# Patient Record
Sex: Female | Born: 1970 | Race: White | Hispanic: No | Smoking: Current every day smoker
Health system: Southern US, Community
[De-identification: ages and names within clinical notes are randomized; demographics above are authoritative.]

---

## 2006-05-14 ENCOUNTER — Ambulatory Visit: Payer: Self-pay | Admitting: General Surgery

## 2006-06-02 ENCOUNTER — Inpatient Hospital Stay: Payer: Self-pay | Admitting: General Surgery

## 2007-02-17 ENCOUNTER — Inpatient Hospital Stay (HOSPITAL_COMMUNITY): Admission: AD | Admit: 2007-02-17 | Discharge: 2007-02-17 | Payer: Self-pay | Admitting: Obstetrics & Gynecology

## 2011-01-10 LAB — WET PREP, GENITAL
Clue Cells Wet Prep HPF POC: NONE SEEN
Trich, Wet Prep: NONE SEEN
Yeast Wet Prep HPF POC: NONE SEEN

## 2011-01-10 LAB — URINALYSIS, ROUTINE W REFLEX MICROSCOPIC
Nitrite: NEGATIVE
Urobilinogen, UA: 0.2

## 2011-01-10 LAB — URINE MICROSCOPIC-ADD ON

## 2011-01-10 LAB — GC/CHLAMYDIA PROBE AMP, GENITAL: Chlamydia, DNA Probe: NEGATIVE

## 2011-01-10 LAB — POCT PREGNANCY, URINE: Preg Test, Ur: NEGATIVE

## 2011-01-10 LAB — CBC
MCV: 92.1
Platelets: 459 — ABNORMAL HIGH

## 2012-05-15 ENCOUNTER — Emergency Department: Payer: Self-pay | Admitting: Internal Medicine

## 2012-05-15 LAB — URINALYSIS, COMPLETE
Glucose,UR: NEGATIVE mg/dL (ref 0–75)
Ketone: NEGATIVE
Protein: NEGATIVE
Specific Gravity: 1.012 (ref 1.003–1.030)
Squamous Epithelial: 4
WBC UR: 2 /HPF (ref 0–5)

## 2012-05-15 LAB — CBC
HGB: 13.4 g/dL (ref 12.0–16.0)
Platelet: 371 10*3/uL (ref 150–440)
RBC: 4.14 10*6/uL (ref 3.80–5.20)

## 2012-05-15 LAB — PREGNANCY, URINE: Pregnancy Test, Urine: NEGATIVE m[IU]/mL

## 2014-05-03 ENCOUNTER — Emergency Department: Payer: Self-pay | Admitting: Emergency Medicine

## 2014-05-03 LAB — CBC WITH DIFFERENTIAL/PLATELET
Basophil #: 0 10*3/uL (ref 0.0–0.1)
Basophil %: 0.3 %
EOS PCT: 0.5 %
Eosinophil #: 0.1 10*3/uL (ref 0.0–0.7)
HCT: 39 % (ref 35.0–47.0)
HGB: 13 g/dL (ref 12.0–16.0)
LYMPHS PCT: 8.4 %
Lymphocyte #: 1.3 10*3/uL (ref 1.0–3.6)
MCH: 31.4 pg (ref 26.0–34.0)
MCHC: 33.4 g/dL (ref 32.0–36.0)
MCV: 94 fL (ref 80–100)
MONO ABS: 0.6 x10 3/mm (ref 0.2–0.9)
MONOS PCT: 4 %
NEUTROS PCT: 86.8 %
Neutrophil #: 13.8 10*3/uL — ABNORMAL HIGH (ref 1.4–6.5)
PLATELETS: 373 10*3/uL (ref 150–440)
RBC: 4.16 10*6/uL (ref 3.80–5.20)
RDW: 12.7 % (ref 11.5–14.5)
WBC: 15.9 10*3/uL — AB (ref 3.6–11.0)

## 2014-05-03 LAB — COMPREHENSIVE METABOLIC PANEL
ALT: 14 U/L (ref 14–63)
ANION GAP: 8 (ref 7–16)
AST: 17 U/L (ref 15–37)
Albumin: 4.5 g/dL (ref 3.4–5.0)
Alkaline Phosphatase: 42 U/L — ABNORMAL LOW (ref 46–116)
BUN: 19 mg/dL — AB (ref 7–18)
Bilirubin,Total: 0.7 mg/dL (ref 0.2–1.0)
CALCIUM: 9 mg/dL (ref 8.5–10.1)
CHLORIDE: 109 mmol/L — AB (ref 98–107)
CO2: 23 mmol/L (ref 21–32)
CREATININE: 0.91 mg/dL (ref 0.60–1.30)
Glucose: 99 mg/dL (ref 65–99)
Osmolality: 282 (ref 275–301)
Potassium: 3.8 mmol/L (ref 3.5–5.1)
Sodium: 140 mmol/L (ref 136–145)
Total Protein: 7.1 g/dL (ref 6.4–8.2)

## 2014-05-03 LAB — URINALYSIS, COMPLETE
BLOOD: NEGATIVE
Bacteria: NONE SEEN
Bilirubin,UR: NEGATIVE
GLUCOSE, UR: NEGATIVE mg/dL (ref 0–75)
Ketone: NEGATIVE
Leukocyte Esterase: NEGATIVE
Nitrite: NEGATIVE
PROTEIN: NEGATIVE
Ph: 7 (ref 4.5–8.0)
RBC,UR: 2 /HPF (ref 0–5)
SPECIFIC GRAVITY: 1.023 (ref 1.003–1.030)
WBC UR: 2 /HPF (ref 0–5)

## 2014-05-03 LAB — LIPASE, BLOOD: Lipase: 106 U/L (ref 73–393)

## 2014-05-03 LAB — TROPONIN I

## 2016-01-04 ENCOUNTER — Emergency Department: Payer: Self-pay

## 2016-01-04 ENCOUNTER — Encounter: Payer: Self-pay | Admitting: *Deleted

## 2016-01-04 ENCOUNTER — Emergency Department
Admission: EM | Admit: 2016-01-04 | Discharge: 2016-01-04 | Disposition: A | Discharge status: Home / Self Care | Payer: Self-pay | Attending: Emergency Medicine | Admitting: Emergency Medicine

## 2016-01-04 DIAGNOSIS — R079 Chest pain, unspecified: Secondary | ICD-10-CM | POA: Insufficient documentation

## 2016-01-04 DIAGNOSIS — G43809 Other migraine, not intractable, without status migrainosus: Secondary | ICD-10-CM | POA: Insufficient documentation

## 2016-01-04 DIAGNOSIS — F17200 Nicotine dependence, unspecified, uncomplicated: Secondary | ICD-10-CM | POA: Insufficient documentation

## 2016-01-04 DIAGNOSIS — R0602 Shortness of breath: Secondary | ICD-10-CM | POA: Insufficient documentation

## 2016-01-04 LAB — CBC
HEMATOCRIT: 39.9 % (ref 35.0–47.0)
HEMOGLOBIN: 13.3 g/dL (ref 12.0–16.0)
MCH: 28.9 pg (ref 26.0–34.0)
MCHC: 33.3 g/dL (ref 32.0–36.0)
MCV: 86.8 fL (ref 80.0–100.0)
Platelets: 456 10*3/uL — ABNORMAL HIGH (ref 150–440)
RBC: 4.6 MIL/uL (ref 3.80–5.20)
RDW: 15.4 % — ABNORMAL HIGH (ref 11.5–14.5)
WBC: 8.1 10*3/uL (ref 3.6–11.0)

## 2016-01-04 LAB — BASIC METABOLIC PANEL
ANION GAP: 9 (ref 5–15)
BUN: 6 mg/dL (ref 6–20)
CHLORIDE: 102 mmol/L (ref 101–111)
CO2: 25 mmol/L (ref 22–32)
Calcium: 9.3 mg/dL (ref 8.9–10.3)
Creatinine, Ser: 0.78 mg/dL (ref 0.44–1.00)
GFR calc non Af Amer: >60 mL/min (ref >60)
Glucose, Bld: 96 mg/dL (ref 65–99)
POTASSIUM: 4.2 mmol/L (ref 3.5–5.1)
Sodium: 136 mmol/L (ref 135–145)

## 2016-01-04 LAB — TROPONIN I

## 2016-01-04 MED ORDER — LORAZEPAM 2 MG/ML IJ SOLN
1.0000 mg | Freq: Once | INTRAMUSCULAR | Status: AC
  Administered 2016-01-04: 1 mg via INTRAVENOUS
  Filled 2016-01-04: qty 1

## 2016-01-04 MED ORDER — PROMETHAZINE HCL 25 MG/ML IJ SOLN
25.0000 mg | Freq: Once | INTRAMUSCULAR | Status: AC
  Administered 2016-01-04: 25 mg via INTRAVENOUS

## 2016-01-04 MED ORDER — PROMETHAZINE HCL 25 MG/ML IJ SOLN
INTRAMUSCULAR | Status: AC
  Administered 2016-01-04: 25 mg via INTRAVENOUS
  Filled 2016-01-04: qty 1

## 2016-01-04 MED ORDER — METHYLPREDNISOLONE SODIUM SUCC 125 MG IJ SOLR
125.0000 mg | Freq: Once | INTRAMUSCULAR | Status: AC
  Administered 2016-01-04: 125 mg via INTRAVENOUS
  Filled 2016-01-04 (×2): qty 2

## 2016-01-04 MED ORDER — METOCLOPRAMIDE HCL 5 MG/ML IJ SOLN
10.0000 mg | Freq: Once | INTRAMUSCULAR | Status: AC
  Administered 2016-01-04: 10 mg via INTRAVENOUS
  Filled 2016-01-04 (×2): qty 2

## 2016-01-04 MED ORDER — KETOROLAC TROMETHAMINE 30 MG/ML IJ SOLN
30.0000 mg | Freq: Once | INTRAMUSCULAR | Status: AC
  Administered 2016-01-04: 30 mg via INTRAVENOUS
  Filled 2016-01-04: qty 1

## 2016-01-04 MED ORDER — SODIUM CHLORIDE 0.9 % IV BOLUS (SEPSIS)
1000.0000 mL | Freq: Once | INTRAVENOUS | Status: AC
  Administered 2016-01-04: 1000 mL via INTRAVENOUS

## 2016-01-04 MED ORDER — DIPHENHYDRAMINE HCL 50 MG/ML IJ SOLN
50.0000 mg | Freq: Once | INTRAMUSCULAR | Status: AC
  Administered 2016-01-04: 50 mg via INTRAVENOUS
  Filled 2016-01-04: qty 1

## 2016-01-04 NOTE — Discharge Instructions (Signed)
Please follow-up your neurologist as soon as possible regarding her continued migraine headache. Return to the emergency department for any increased chest pain, trouble breathing, or any other symptom personally concerning to yourself.

## 2016-01-04 NOTE — ED Notes (Signed)
Pt reports that she stepped out of the shower and had shooting pain that went up her left leg to her back and up into her chest and head.  Pt states that she is still hurting everywhere and is visibly anxious.  Pt reports history of anxiety and reports a headache at this time.

## 2016-01-04 NOTE — ED Provider Notes (Addendum)
[facility]  Time seen: 7:34 PM  I have reviewed the triage vital signs and the nursing notes.   HISTORY  Chief Complaint Chest Pain    HPI Jennifer Horne is a 45 y.o. female with a past medical history of brain aneurysm, who presents the emergency department for headache and chest pain. According to the patient approximately 1 hour ago she had sudden onset of pain that shot up from her left foot all the way up her leg and into her chest. Patient states since that time she has been in the panic attack breathing heavily with a lot of anxiety. She took her Klonopin at home without relief. Patient states she had a history of a brain aneurysm in the past, she is very scared that this could be the brain aneurysm acting up again. She does state a moderate headache currently, but states she always has headaches and migraines occur on a near daily basis for her. Denies any focal weakness or numbness. Denies nausea or vomiting. Denies fever. States pain remains in her chest, but she often gets panic attacks with similar chest pain.   No past medical history on file.  There are no active problems to display for this patient.   No past surgical history on file.  Prior to Admission medications   Not on File    No Known Allergies  No family history on file.  Social History Social History  Substance Use Topics  . Smoking status: Current Every Day Smoker  . Smokeless tobacco: Never Used  . Alcohol use Yes    Review of Systems Constitutional: Negative for fever Cardiovascular: Positive for chest pain, but relates this to panic attacks. Respiratory: Some shortness of breath, but she states she is feeling very anxious. Musculoskeletal: Negative for back pain. Skin: Negative for rash. Neurological: Positive for headache, but states headaches on a near daily basis, and this was no worse than her typical headache 10-point ROS  otherwise negative.  ____________________________________________   PHYSICAL EXAM:  VITAL SIGNS: ED Triage Vitals  Enc Vitals Group     BP 01/04/16 1911 (!) 142/84     Pulse Rate 01/04/16 1911 96     Resp 01/04/16 1911 20     Temp 01/04/16 1911 98.2 F (36.8 C)     Temp Source 01/04/16 1911 Oral     SpO2 01/04/16 1911 100 %     Weight 01/04/16 1911 140 lb (63.5 kg)     Height 01/04/16 1911 5\' 1"  (1.549 m)     Head Circumference --      Peak Flow --      Pain Score 01/04/16 1915 9     Pain Loc --      Pain Edu? --      Excl. in GC? --     Constitutional: Alert and oriented. Very anxious appearing, crying at times. Eyes: Normal exam ENT   Head: Normocephalic and atraumatic.   Mouth/Throat: Mucous membranes are moist. Cardiovascular: Normal rate, regular rhythm. No murmur Respiratory: Normal respiratory effort without tachypnea nor retractions. Breath sounds are clear  Gastrointestinal: Soft and nontender. No distention.  Musculoskeletal: Nontender with normal range of motion in all extremities. No lower extremity tenderness or edema. Neurologic:  Normal speech and language. No gross focal neurologic deficits Skin:  Skin is warm, dry and intact.  Psychiatric: Mood and affect are normal. Speech and behavior are normal.   ____________________________________________    EKG  EKG reviewed and interpreted, so shows normal sinus rhythm at 89 bpm, narrow QRS, normal axis, normal intervals, no concerning ST changes.  ____________________________________________    RADIOLOGY  Chest x-ray negative  ____________________________________________   INITIAL IMPRESSION / ASSESSMENT AND PLAN / ED COURSE  Pertinent labs & imaging results that were available during my care of the patient were reviewed by me and considered in my medical decision making (see chart for details).  Patient presents the emergency department with pain that started in her foot when all the way  up her leg into her chest. Now she states she has a moderate headache as well. Patient is very concerned that she has a history of a brain aneurysm in the past, however she states the headache feels like her normal daily headache. States she often gets chest pain with her anxiety. States she takes 2 mg Klonopin 3 times daily for anxiety. Overall the patient has a normal physical examination besides appearing quite anxious, tearful at times. We will check labs including cardiac enzymes, treat the patient's anxiety with IV Ativan, IV fluids and Toradol for the headache, and closely monitor in the emergency department. I discussed with the patient tinea CT scan, patient is reluctant states she has had multiple including 1 recently.  Patient's workup for the chest pain including labs, troponin chest x-ray and EKG are largely within normal limits. Troponin is negative. Patient continues to state she is having significant headache. I again discussed obtaining a head CT with the patient, she is adamantly against obtaining a head CT, states she will not have any brain imaging unless it is at [facility] by her neurologist.. Patient is very upset in the emergency department states she always gets Demerol for her migraines. I discussed with the patient that I do not use narcotic medications to treat migraine headaches, which has significantly upset the patient. She states she is leaving and going to [facility]. Then she states she doesn't have a ride and to give me something else for the headache. We will continue with the migraine cocktail with Reglan, Benadryl and Solu-Medrol. Patient is much more well appearing at this time. No longer appears anxious, but very angry appearing.  Patient states her headache is a 9/10 still. She still does not wish to proceed with a CT scan. States only thing that works for her in the past as Demerol. We will discharge the patient per her request. ____________________________________________   FINAL  CLINICAL IMPRESSION(S) / ED DIAGNOSES  Migraine headache    Minna Antis, MD 01/04/16 2034    Minna Antis, MD 01/04/16 2205

## 2016-01-04 NOTE — ED Notes (Signed)
Pt reports that the medication given did not help at all.  Pt visibly looks less anxious at this time.  EDP notified.

## 2016-01-04 NOTE — ED Triage Notes (Signed)
Pt reports pain in left leg radiating all the way up  Into her body, into chest and head after getting out of shower today.  Pt states pain brought her to her knees.   Pt anxious.  Pt has a cough for 1 week.  Pt alert.  Speech clear.

## 2016-03-20 IMAGING — CT CT ABD-PELV W/ CM
2 of 5 series · 16 of 46 positions shown, 18 images · IV contrast (omnipaque)
Comparison: 05/15/2012

CLINICAL DATA: N/v starting at 5 am today, headache, "labor pains",
started lmp yesterday, hx of btl and divert

EXAM:
CT ABDOMEN AND PELVIS WITH CONTRAST
TECHNIQUE: Multidetector CT imaging of the abdomen and pelvis was performed
using the standard protocol following bolus administration of
intravenous contrast.
CONTRAST:  85 mL of Omnipaque 300 intravenous contrast.

[Series 2: routine abd pel with · axial · 0.56mm/px · z∈[-450,-100]mm · 13 of 80 slices shown, 15 images]
[im 5/80  soft-tissue]
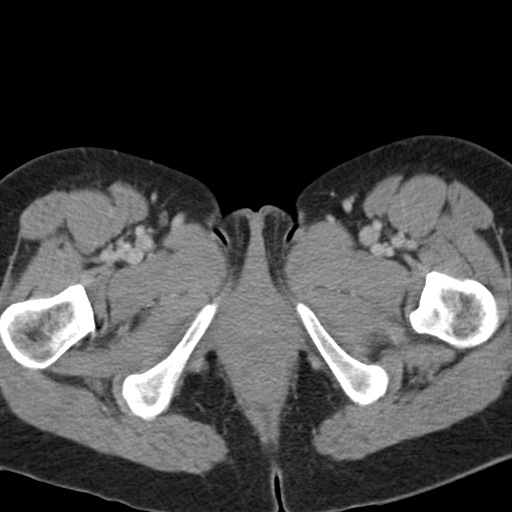
[im 5/80  bone]
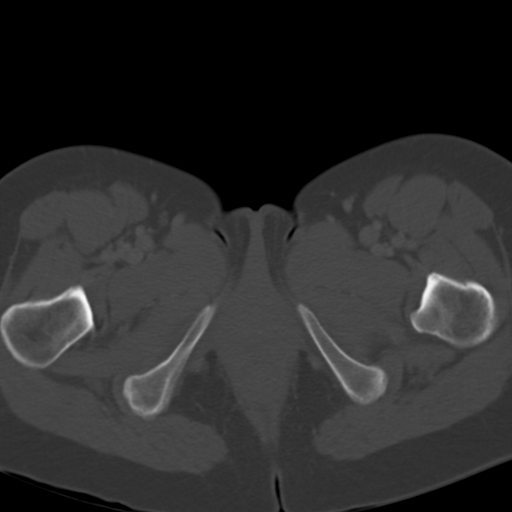
[im 13/80  soft-tissue]
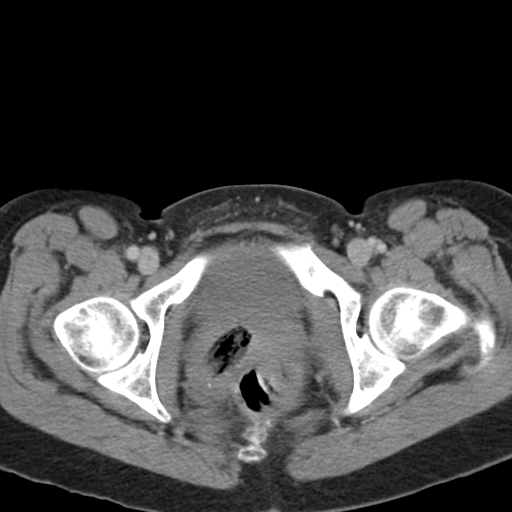
[im 17/80  soft-tissue]
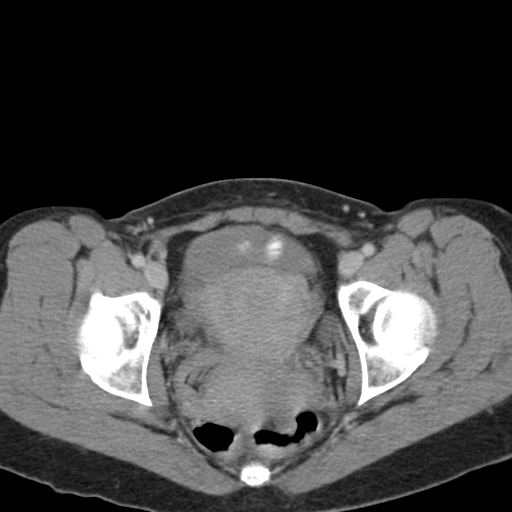
[im 21/80  soft-tissue]
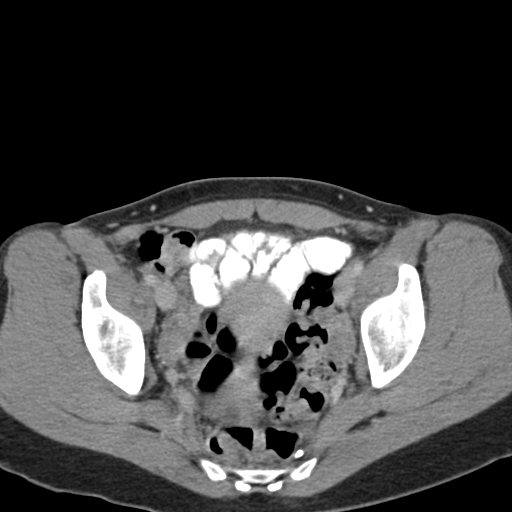
[im 30/80  soft-tissue]
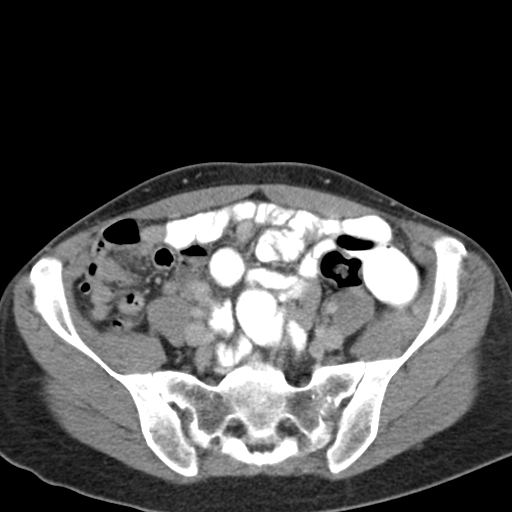
[im 34/80  soft-tissue]
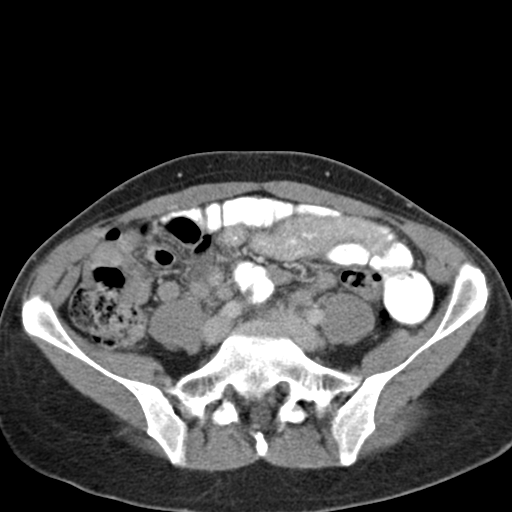
[im 42/80  soft-tissue]
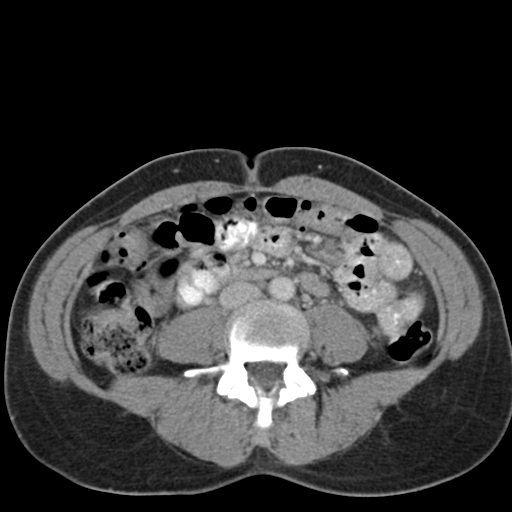
[im 46/80  soft-tissue]
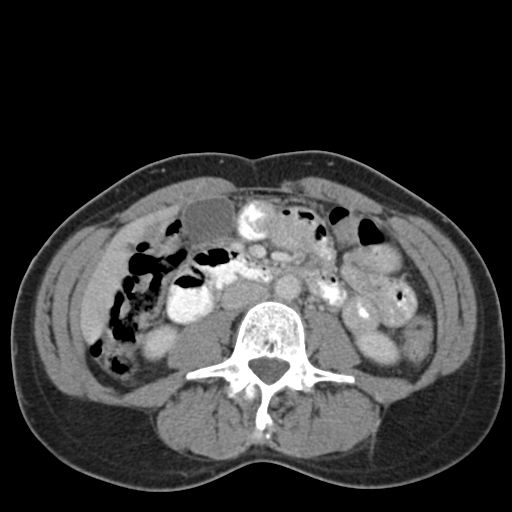
[im 50/80  soft-tissue]
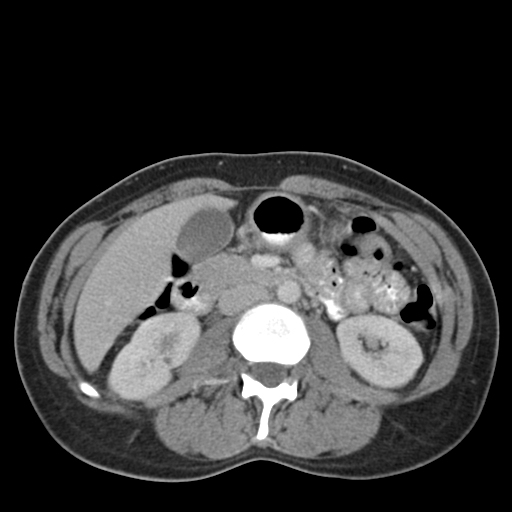
[im 50/80  bone]
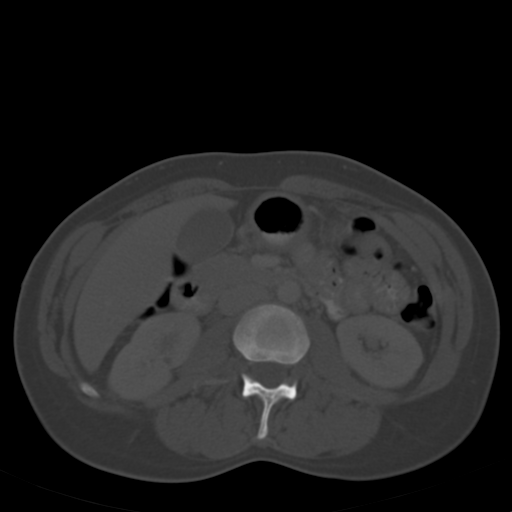
[im 59/80  soft-tissue]
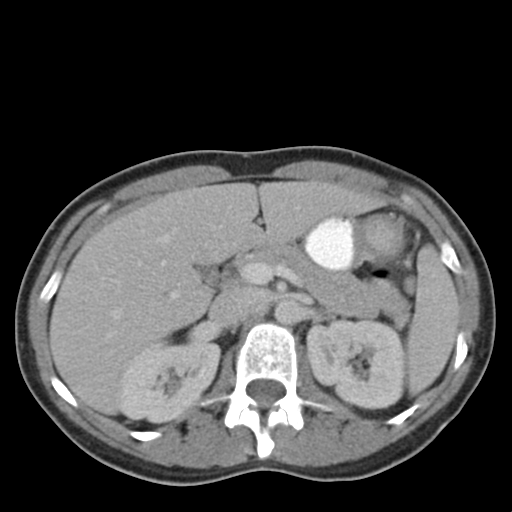
[im 63/80  soft-tissue]
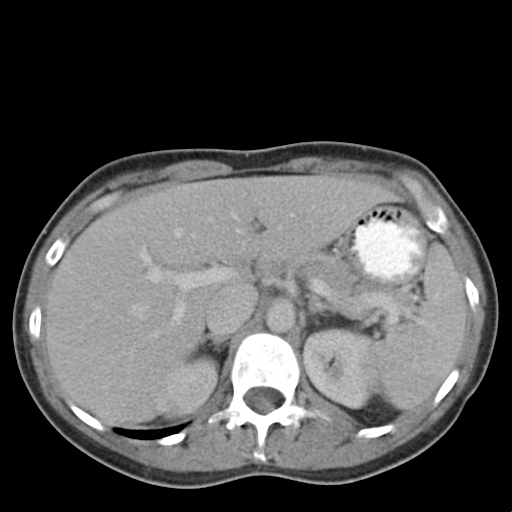
[im 67/80  soft-tissue]
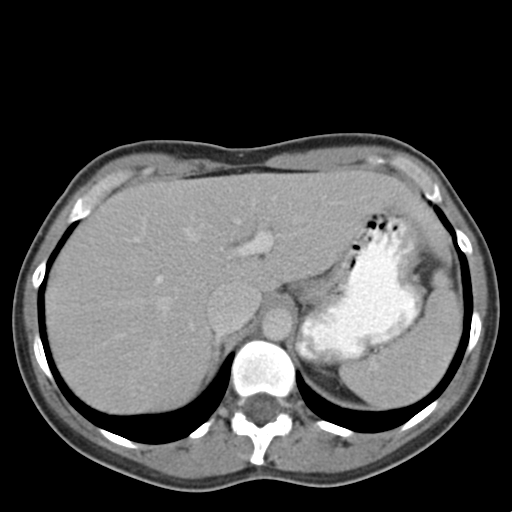
[im 75/80  soft-tissue]
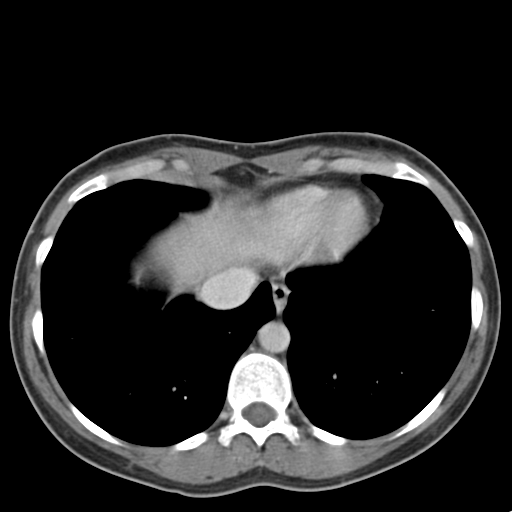

[Series 6: cor routine abd pel with · coronal · 0.58mm/px · 3 of 100 slices shown]
[im 34/100  soft-tissue]
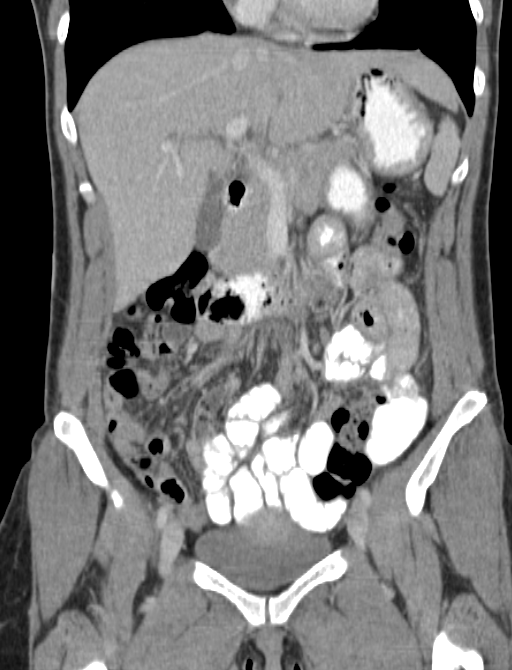
[im 45/100  soft-tissue]
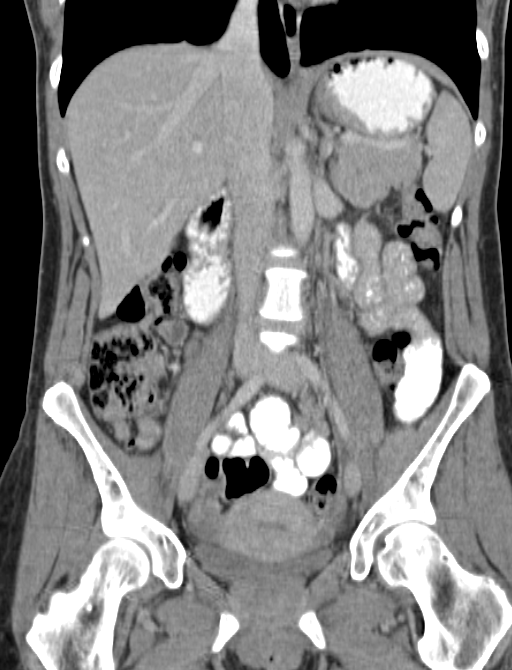
[im 56/100  soft-tissue]
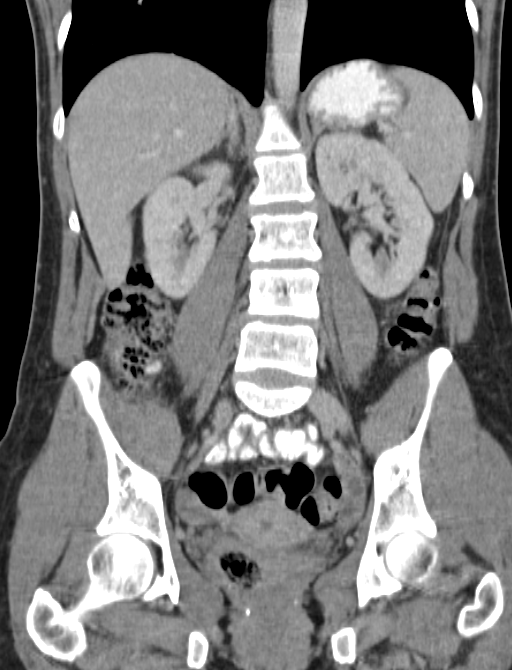

[16 of 46 positions shown; findings below may reference images not displayed]

FINDINGS: Clear lung bases.  Heart normal in size.

Liver, spleen, gallbladder, pancreas, adrenal glands: Normal.

6 mm low-density lesion in the lower pole of the left kidney and 4
mm low-density lesion the lower pole of the right kidney, both
likely cysts. No other renal abnormality. Normal ureters. Normal
bladder.

Uterus and adnexa are unremarkable.

No adenopathy.  No abnormal fluid collections.

Normal bowel.  Normal appendix.

No bony abnormality.
IMPRESSION: 1. No acute findings. No findings to explain this patient's
symptoms.
2. Two sub cm presumed renal cysts, 1 on each side.
3. No other abnormalities.  Normal appendix visualized.

## 2017-11-21 IMAGING — CR DG CHEST 2V
1 series · 2 of 2 positions shown · non-contrast
Comparison: March 01, 2015

CLINICAL DATA: Chest pain and cough

EXAM:
CHEST  2 VIEW

[Series 1: dg chest 2 view · 0.14mm/px · 2 of 2 slices shown]
[im 1/2]
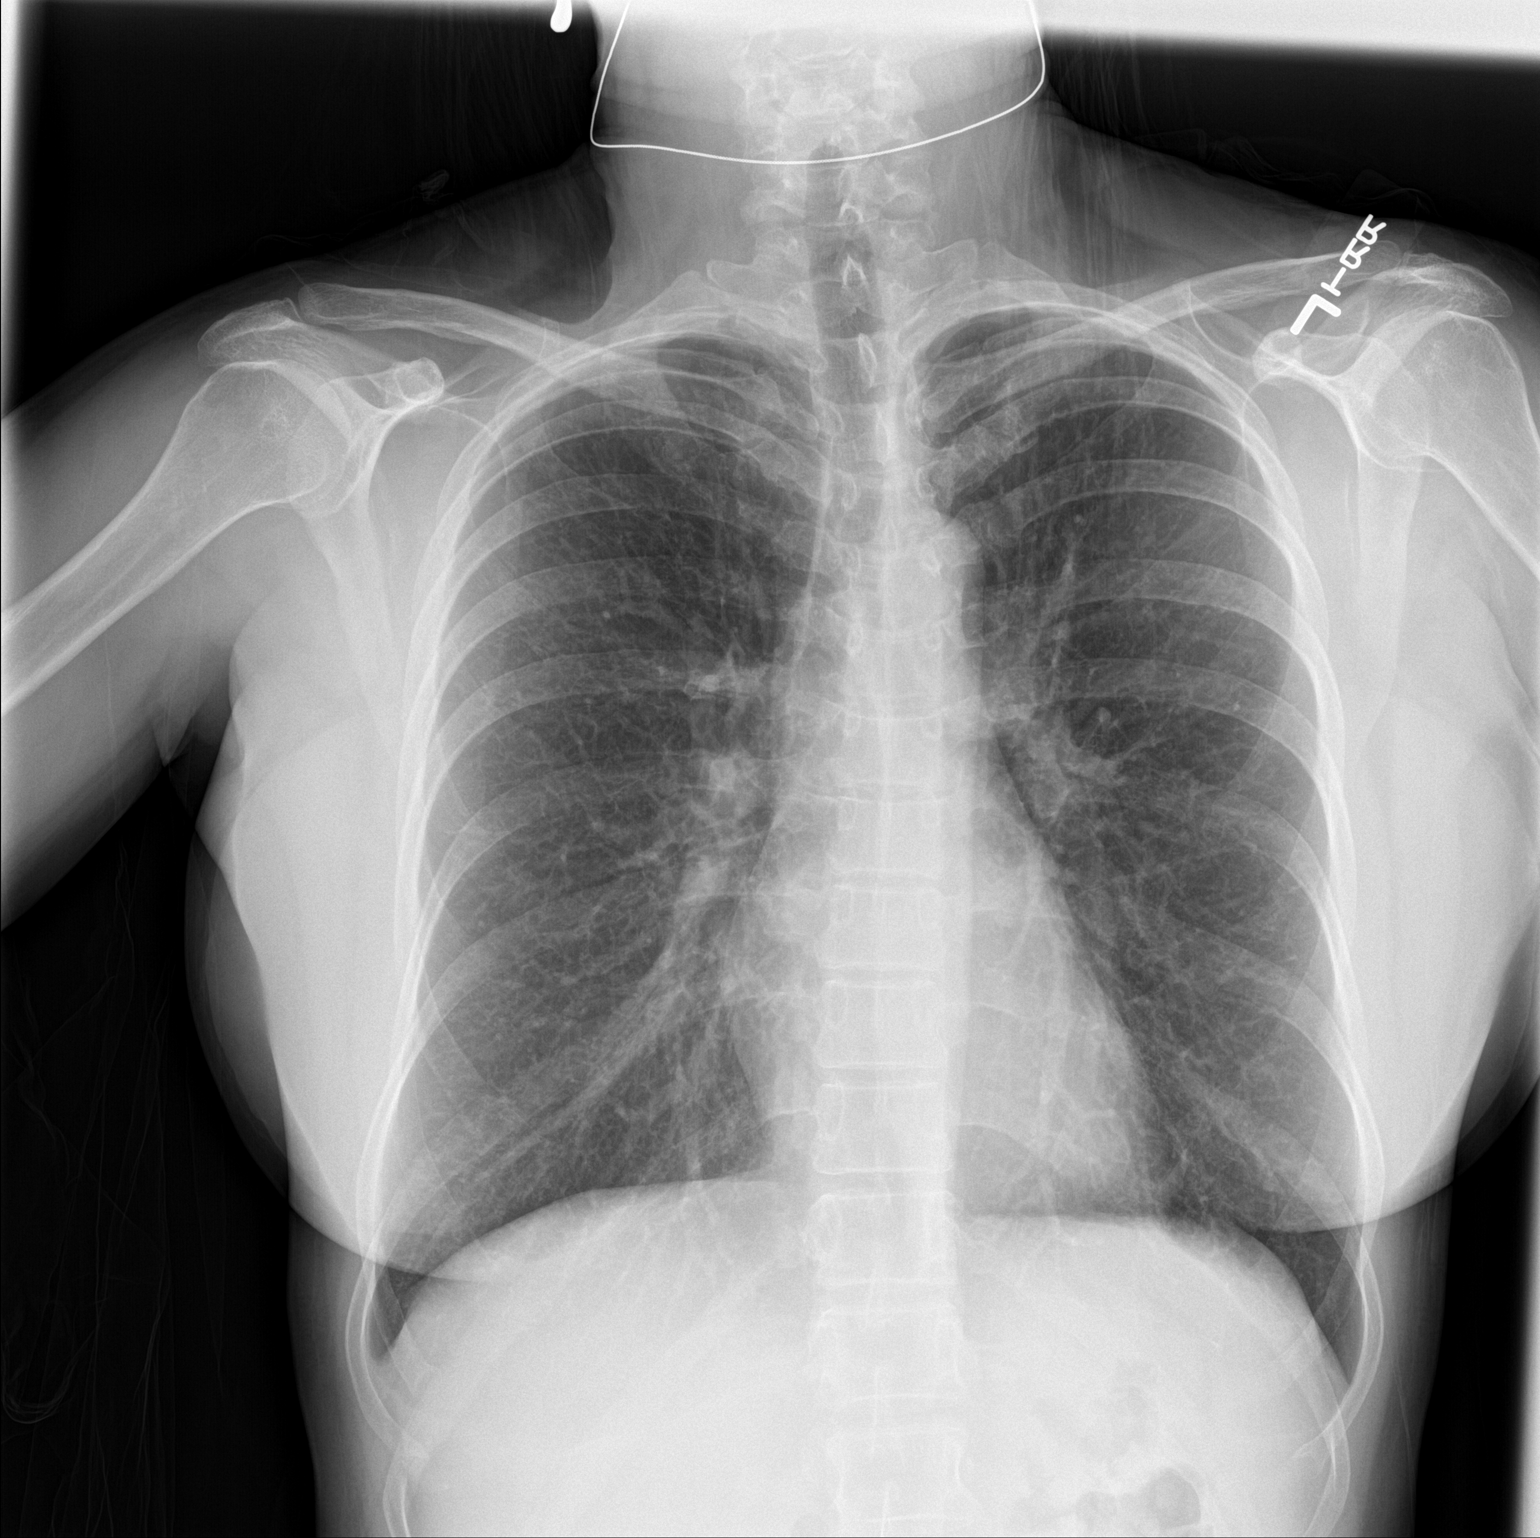
[im 2/2]
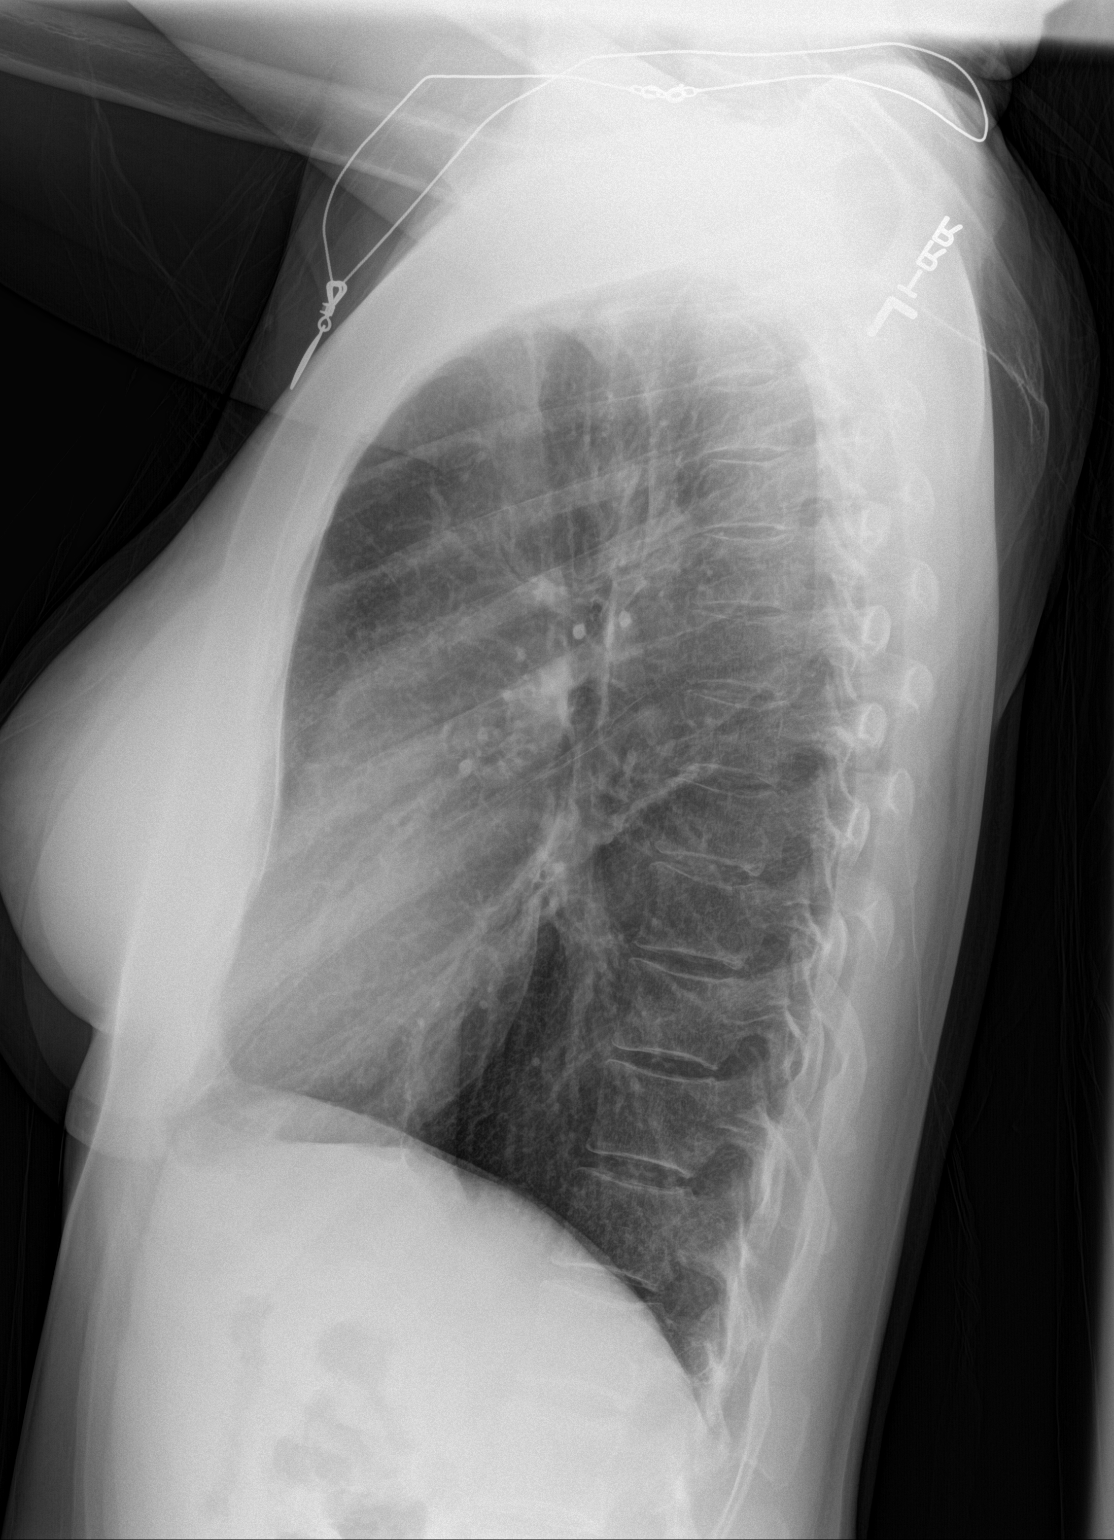

[2 of 2 positions shown; findings below may reference images not displayed]

FINDINGS: Lungs are clear. Heart size and pulmonary vascularity are normal. No
adenopathy. No pneumothorax. No bone lesions.
IMPRESSION: No edema or consolidation.

## 2019-03-26 ENCOUNTER — Ambulatory Visit: Payer: Self-pay

## 2019-03-26 ENCOUNTER — Ambulatory Visit: Payer: HRSA Program | Attending: Internal Medicine

## 2019-03-26 DIAGNOSIS — Z20822 Contact with and (suspected) exposure to covid-19: Secondary | ICD-10-CM

## 2019-03-26 DIAGNOSIS — Z20828 Contact with and (suspected) exposure to other viral communicable diseases: Secondary | ICD-10-CM | POA: Insufficient documentation

## 2019-03-26 NOTE — Telephone Encounter (Signed)
Call returned to patient.. She states that she was exposed to a lab worker last Wednesday who tested positive yesterday for COVID-19. She has appointment at our facility for COVID-19 test today.  She was assured that testing is within the recommended 6-8 day window after exposure.  She states that she just has not felt well for a long time because she has multiple health issues. Several surgeries and procedures since start of COVID-19. Quarantine guidelines explained to patient.  She verbalized understanding and will test today as scheduled.   Reason for Disposition . General information question, no triage required and triager able to answer question  Answer Assessment - Initial Assessment Questions 1. REASON FOR CALL or QUESTION: "What is your reason for calling today?" or "How can I best help you?" or "What question do you have that I can help answer?"     How many days after positive exposure should I wait before testing?  Protocols used: INFORMATION ONLY CALL - NO TRIAGE-A-AH

## 2019-03-27 LAB — NOVEL CORONAVIRUS, NAA: SARS-CoV-2, NAA: NOT DETECTED
